# Patient Record
Sex: Female | Born: 1935 | Race: White | Hispanic: No
Health system: Southern US, Community
[De-identification: ages and names within clinical notes are randomized; demographics above are authoritative.]

---

## 2013-12-28 ENCOUNTER — Inpatient Hospital Stay
Admission: AD | Admit: 2013-12-28 | Discharge: 2014-02-02 | Disposition: A | Discharge status: Skilled Nursing Facility | Payer: Self-pay | Source: Ambulatory Visit | Attending: Internal Medicine | Admitting: Internal Medicine

## 2013-12-28 DIAGNOSIS — R140 Abdominal distension (gaseous): Secondary | ICD-10-CM

## 2013-12-29 ENCOUNTER — Other Ambulatory Visit (HOSPITAL_COMMUNITY): Payer: Self-pay

## 2013-12-29 LAB — BLOOD GAS, ARTERIAL
Acid-Base Excess: 3.5 mmol/L — ABNORMAL HIGH (ref 0.0–2.0)
Acid-Base Excess: 1.3 mmol/L (ref 0.0–2.0)
BICARBONATE: 31.3 meq/L — AB (ref 20.0–24.0)
Bicarbonate: 28.5 mEq/L — ABNORMAL HIGH (ref 20.0–24.0)
DELIVERY SYSTEMS: POSITIVE
Expiratory PAP: 5
FIO2: 0.6 %
Inspiratory PAP: 10
O2 Content: 4 L/min
O2 SAT: 95.9 %
O2 Saturation: 89.4 %
PATIENT TEMPERATURE: 98.6
PCO2 ART: 86.9 mmHg — AB (ref 35.0–45.0)
Patient temperature: 98.6
TCO2: 34 mmol/L (ref 0–100)
TCO2: 30.8 mmol/L (ref 0–100)
pCO2 arterial: 74.6 mmHg (ref 35.0–45.0)
pH, Arterial: 7.182 — CL (ref 7.350–7.450)
pH, Arterial: 7.207 — ABNORMAL LOW (ref 7.350–7.450)
pO2, Arterial: 63.5 mmHg — ABNORMAL LOW (ref 80.0–100.0)
pO2, Arterial: 88.4 mmHg (ref 80.0–100.0)

## 2013-12-29 LAB — FOLATE RBC: RBC Folate: 956 ng/mL — ABNORMAL HIGH (ref >280)

## 2013-12-29 LAB — CBC WITH DIFFERENTIAL/PLATELET
BASOS PCT: 1 % (ref 0–1)
Basophils Absolute: 0.1 10*3/uL (ref 0.0–0.1)
Eosinophils Absolute: 0.1 10*3/uL (ref 0.0–0.7)
Eosinophils Relative: 1 % (ref 0–5)
HCT: 36 % (ref 36.0–46.0)
HEMOGLOBIN: 11.2 g/dL — AB (ref 12.0–15.0)
LYMPHS PCT: 8 % — AB (ref 12–46)
Lymphs Abs: 0.8 10*3/uL (ref 0.7–4.0)
MCH: 29.9 pg (ref 26.0–34.0)
MCHC: 31.1 g/dL (ref 30.0–36.0)
MCV: 96.3 fL (ref 78.0–100.0)
MONO ABS: 0.4 10*3/uL (ref 0.1–1.0)
Monocytes Relative: 4 % (ref 3–12)
NEUTROS PCT: 86 % — AB (ref 43–77)
Neutro Abs: 9 10*3/uL — ABNORMAL HIGH (ref 1.7–7.7)
PLATELETS: 225 10*3/uL (ref 150–400)
RBC: 3.74 MIL/uL — AB (ref 3.87–5.11)
RDW: 16.2 % — ABNORMAL HIGH (ref 11.5–15.5)
WBC: 10.4 10*3/uL (ref 4.0–10.5)

## 2013-12-29 LAB — COMPREHENSIVE METABOLIC PANEL
ALK PHOS: 82 U/L (ref 39–117)
ALT: 13 U/L (ref 0–35)
AST: 16 U/L (ref 0–37)
Albumin: 2.1 g/dL — ABNORMAL LOW (ref 3.5–5.2)
BUN: 30 mg/dL — ABNORMAL HIGH (ref 6–23)
CALCIUM: 9.9 mg/dL (ref 8.4–10.5)
CO2: 29 mEq/L (ref 19–32)
Chloride: 105 mEq/L (ref 96–112)
Creatinine, Ser: 0.46 mg/dL — ABNORMAL LOW (ref 0.50–1.10)
GFR calc Af Amer: >90 mL/min (ref >90)
GFR calc non Af Amer: >90 mL/min (ref >90)
GLUCOSE: 175 mg/dL — AB (ref 70–99)
POTASSIUM: 4.7 meq/L (ref 3.7–5.3)
SODIUM: 140 meq/L (ref 137–147)
Total Bilirubin: 0.3 mg/dL (ref 0.3–1.2)
Total Protein: 5.1 g/dL — ABNORMAL LOW (ref 6.0–8.3)

## 2013-12-29 LAB — APTT: aPTT: 30 seconds (ref 24–37)

## 2013-12-29 LAB — MAGNESIUM: Magnesium: 2 mg/dL (ref 1.5–2.5)

## 2013-12-29 LAB — HEMOGLOBIN A1C
HEMOGLOBIN A1C: 5.5 % (ref <5.7)
MEAN PLASMA GLUCOSE: 111 mg/dL (ref <117)

## 2013-12-29 LAB — PROTIME-INR
INR: 1.12 (ref 0.00–1.49)
PROTHROMBIN TIME: 14.2 s (ref 11.6–15.2)

## 2013-12-29 LAB — VITAMIN B12: Vitamin B-12: 678 pg/mL (ref 211–911)

## 2013-12-29 LAB — FERRITIN: Ferritin: 144 ng/mL (ref 10–291)

## 2013-12-29 LAB — TSH: TSH: 1.08 u[IU]/mL (ref 0.350–4.500)

## 2013-12-29 LAB — T4, FREE: Free T4: 0.7 ng/dL — ABNORMAL LOW (ref 0.80–1.80)

## 2013-12-29 LAB — PROCALCITONIN

## 2013-12-29 LAB — PREALBUMIN: PREALBUMIN: 13.9 mg/dL — AB (ref 17.0–34.0)

## 2013-12-29 LAB — PHOSPHORUS: Phosphorus: 3.7 mg/dL (ref 2.3–4.6)

## 2013-12-29 NOTE — Procedures (Signed)
US Rt thora  550 cc yellow fluid cxr pending

## 2013-12-29 NOTE — Progress Notes (Addendum)
[facility]                                                                                              Progress note     Patient Demographics  Donna Cervantes, is a [age] y.o. female  ZOX:[id number]  WJX:[id number]  DOB - 11/18/1931  Admit date - 12/28/2013  Admitting Physician Carron CurieAli Viktoriya Glaspy, MD  Outpatient Primary MD for the patient is Default, Provider, MD  LOS - 1   Chief complaint   Respiratory failure   Wound   Status post perforated bowel was septic shock status post 6 without ectopy laparotomy and 4/22   Deconditioning         Subjective:   Donna PennerMary Cervantes obtunded and cannot give any history  Objective:   Vital signs  Temperature 96 Heart rate 56 Respiratory rate 22 Blood pressure 100/58 Pulse ox 93%    Exam Obtunded, [place].AT,PERRAL Supple Neck,No JVD, No cervical lymphadenopathy appriciated.  Symmetrical Chest wall movement, poor respiratory efforts RRR,No Gallops,Rubs or new Murmurs, No Parasternal Heave +ve B.Sounds, Abd Soft, wound noted healing with primary intention, colostomy noted No Cyanosis, Clubbing + edema, No new Rash or bruise    I&Os 563  Data Review   CBC  Recent Labs Lab 12/29/13 0500  WBC 10.4  HGB 11.2*  HCT 36.0  PLT 225  MCV 96.3  MCH 29.9  MCHC 31.1  RDW 16.2*  LYMPHSABS 0.8  MONOABS 0.4  EOSABS 0.1  BASOSABS 0.1    Chemistries   Recent Labs Lab 12/29/13 0500  NA 140  K 4.7  CL 105  CO2 29  GLUCOSE 175*  BUN 30*  CREATININE 0.46*  CALCIUM 9.9  MG 2.0  AST 16  ALT 13  ALKPHOS 82  BILITOT 0.3   ------------------------------------------------------------------------------------------------------------------ CrCl is unknown because there is no height on file for the current visit. ------------------------------------------------------------------------------------------------------------------ No results found for this  basename: HGBA1C,  in the last 72 hours ------------------------------------------------------------------------------------------------------------------ No results found for this basename: CHOL, HDL, LDLCALC, TRIG, CHOLHDL, LDLDIRECT,  in the last 72 hours ------------------------------------------------------------------------------------------------------------------  Recent Labs  12/29/13 0445  TSH 1.080   ------------------------------------------------------------------------------------------------------------------  Recent Labs  12/29/13 0500  VITAMINB12 678  FERRITIN 144    Coagulation profile  Recent Labs Lab 12/29/13 0500  INR 1.12    No results found for this basename: DDIMER,  in the last 72 hours  Cardiac Enzymes No results found for this basename: CK, CKMB, TROPONINI, MYOGLOBIN,  in the last 168 hours ------------------------------------------------------------------------------------------------------------------ No components found with this basename: POCBNP,   Micro Results No results found for this or any previous visit (from the past 240 hour(s)).     Assessment & Plan    Acute hypercarbic respiratory failure with elevated CO2 in the 80s Diastolic congestive heart failure  wound healing by primary intention History of perforated bowel with cecal volvulus  status post emergent laparotomy and 4/22 with a      right hemicolectomy History of GI bleed with resulting anemia Diabetes mellitus type 2 COPD History of left lower extremity DVT Descending thoracic aortic aneurysm Protein calorie malnutrition  on TPN Altered mental status Generalized weakness with deconditioning Large bilateral pleural effusions  Plan Place on BiPAP Lasix  consult IR for thoracentesis Check labs in a.m.  critical care time 38 minutes   Code Status: DO NOT RESUSCITATE  DVT Prophylaxis  SCDs   Carron CurieAli Viktoria Gruetzmacher M.D on 12/29/2013 at 11:04 AM

## 2013-12-30 ENCOUNTER — Other Ambulatory Visit (HOSPITAL_COMMUNITY): Payer: Self-pay

## 2013-12-30 DIAGNOSIS — I369 Nonrheumatic tricuspid valve disorder, unspecified: Secondary | ICD-10-CM

## 2013-12-30 LAB — BLOOD GAS, ARTERIAL
ACID-BASE EXCESS: 8.8 mmol/L — AB (ref 0.0–2.0)
Acid-Base Excess: 10.1 mmol/L — ABNORMAL HIGH (ref 0.0–2.0)
BICARBONATE: 34.1 meq/L — AB (ref 20.0–24.0)
BICARBONATE: 34.5 meq/L — AB (ref 20.0–24.0)
Delivery systems: POSITIVE
Expiratory PAP: 5
FIO2: 0.4 %
FIO2: 0.4 %
Inspiratory PAP: 10
Mode: POSITIVE
O2 SAT: 92.1 %
O2 Saturation: 98.7 %
PCO2 ART: 59.5 mmHg — AB (ref 35.0–45.0)
PH ART: 7.376 (ref 7.350–7.450)
PH ART: 7.46 — AB (ref 7.350–7.450)
PO2 ART: 101 mmHg — AB (ref 80.0–100.0)
PO2 ART: 57.6 mmHg — AB (ref 80.0–100.0)
Patient temperature: 98.6
Patient temperature: 98.6
TCO2: 35.9 mmol/L (ref 0–100)
TCO2: 36 mmol/L (ref 0–100)
pCO2 arterial: 49.1 mmHg — ABNORMAL HIGH (ref 35.0–45.0)

## 2013-12-30 LAB — BASIC METABOLIC PANEL
BUN: 26 mg/dL — AB (ref 6–23)
CALCIUM: 9.5 mg/dL (ref 8.4–10.5)
CO2: 34 mEq/L — ABNORMAL HIGH (ref 19–32)
Chloride: 106 mEq/L (ref 96–112)
Creatinine, Ser: 0.42 mg/dL — ABNORMAL LOW (ref 0.50–1.10)
GLUCOSE: 108 mg/dL — AB (ref 70–99)
Potassium: 3.8 mEq/L (ref 3.7–5.3)
Sodium: 144 mEq/L (ref 137–147)

## 2013-12-30 LAB — CBC
HEMATOCRIT: 33.6 % — AB (ref 36.0–46.0)
Hemoglobin: 10.3 g/dL — ABNORMAL LOW (ref 12.0–15.0)
MCH: 29.9 pg (ref 26.0–34.0)
MCHC: 30.7 g/dL (ref 30.0–36.0)
MCV: 97.7 fL (ref 78.0–100.0)
Platelets: 229 10*3/uL (ref 150–400)
RBC: 3.44 MIL/uL — ABNORMAL LOW (ref 3.87–5.11)
RDW: 15.9 % — AB (ref 11.5–15.5)
WBC: 10.7 10*3/uL — ABNORMAL HIGH (ref 4.0–10.5)

## 2013-12-30 NOTE — Progress Notes (Signed)
Echo Lab  2D Echocardiogram completed.  Lewis Grivas L Hoby Kawai, RDCS 12/30/2013 2:02 PM   

## 2013-12-30 NOTE — Progress Notes (Signed)
[facility]                                                                                              Progress note     Patient Demographics  Donna Cervantes, is a 80 y.o. female  ZOX:[id number]  WJX:[id number]  DOB - 1932/04/29  Admit date - 12/28/2013  Admitting Physician Carron CurieAli Genelda Roark, MD  Outpatient Primary MD for the patient is Default, Provider, MD  LOS - 2   Chief complaint   Respiratory failure   Wound   Status post perforated bowel was septic shock status post 6 without ectopy laparotomy and 4/22   Deconditioning         Subjective:   Donna PennerMary Tamas more alert and awake on Ventimask  Objective:   Vital signs  Temperature 98.3 Heart rate 54 Respiratory rate 20 Blood pressure 110/60 Pulse ox 98%    Exam More alert and awake [place].AT,PERRAL Supple Neck,No JVD, No cervical lymphadenopathy appriciated.  Symmetrical Chest wall movement, poor air entry, decreased breath sounds at the bases RRR,No Gallops,Rubs or new Murmurs, No Parasternal Heave +ve B.Sounds, Abd Soft, wound noted healing with primary intention, colostomy noted No Cyanosis, Clubbing + edema, No new Rash or bruise    I&Os 1745/3000  Data Review   CBC  Recent Labs Lab 12/29/13 0500 12/30/13 0717  WBC 10.4 10.7*  HGB 11.2* 10.3*  HCT 36.0 33.6*  PLT 225 229  MCV 96.3 97.7  MCH 29.9 29.9  MCHC 31.1 30.7  RDW 16.2* 15.9*  LYMPHSABS 0.8  --   MONOABS 0.4  --   EOSABS 0.1  --   BASOSABS 0.1  --     Chemistries   Recent Labs Lab 12/29/13 0500 12/30/13 0717  NA 140 144  K 4.7 3.8  CL 105 106  CO2 29 34*  GLUCOSE 175* 108*  BUN 30* 26*  CREATININE 0.46* 0.42*  CALCIUM 9.9 9.5  MG 2.0  --   AST 16  --   ALT 13  --   ALKPHOS 82  --   BILITOT 0.3  --    ------------------------------------------------------------------------------------------------------------------ CrCl is unknown because  there is no height on file for the current visit. ------------------------------------------------------------------------------------------------------------------  Recent Labs  12/29/13 0500  HGBA1C 5.5   ------------------------------------------------------------------------------------------------------------------ No results found for this basename: CHOL, HDL, LDLCALC, TRIG, CHOLHDL, LDLDIRECT,  in the last 72 hours ------------------------------------------------------------------------------------------------------------------  Recent Labs  12/29/13 0445  TSH 1.080   ------------------------------------------------------------------------------------------------------------------  Recent Labs  12/29/13 0500  VITAMINB12 678  FERRITIN 144    Coagulation profile  Recent Labs Lab 12/29/13 0500  INR 1.12    No results found for this basename: DDIMER,  in the last 72 hours  Cardiac Enzymes No results found for this basename: CK, CKMB, TROPONINI, MYOGLOBIN,  in the last 168 hours ------------------------------------------------------------------------------------------------------------------ No components found with this basename: POCBNP,   Micro Results No results found for this or any previous visit (from the past 240 hour(s)).     Assessment & Plan    Acute hypercarbic respiratory failure with elevated CO2 in the 80s Diastolic congestive heart failure  wound healing by primary intention History of perforated bowel with cecal volvulus  status post emergent laparotomy and 4/22 with a      right hemicolectomy History of GI bleed with resulting anemia Diabetes mellitus type 2 COPD History of left lower extremity DVT Descending thoracic aortic aneurysm Protein calorie malnutrition on TPN Altered mental status Generalized weakness with deconditioning Large bilateral pleural effusions status post right thoracentesis with 540 mL of fluid  removed  Plan Place on Oxymizer Check ABGs in a.m. Left thoracentesis on Monday Continue diuresis Critical care time 32 minutes   Code Status: DO NOT RESUSCITATE  DVT Prophylaxis  SCDs   Carron CurieAli Egor Fullilove M.D on 12/30/2013 at 11:45 AM

## 2013-12-31 LAB — BLOOD GAS, ARTERIAL
ACID-BASE EXCESS: 13.2 mmol/L — AB (ref 0.0–2.0)
Acid-Base Excess: 12.5 mmol/L — ABNORMAL HIGH (ref 0.0–2.0)
BICARBONATE: 36.4 meq/L — AB (ref 20.0–24.0)
BICARBONATE: 37.2 meq/L — AB (ref 20.0–24.0)
Delivery systems: POSITIVE
Expiratory PAP: 5
FIO2: 0.4 %
INSPIRATORY PAP: 10
O2 Content: 4 L/min
O2 SAT: 97.1 %
O2 SAT: 91.1 %
PCO2 ART: 45.4 mmHg — AB (ref 35.0–45.0)
PH ART: 7.519 — AB (ref 7.350–7.450)
PO2 ART: 60.7 mmHg — AB (ref 80.0–100.0)
PRESSURE SUPPORT: 5 cmH2O
Patient temperature: 98.6
Patient temperature: 98.6
TCO2: 37.8 mmol/L (ref 0–100)
TCO2: 38.6 mmol/L (ref 0–100)
pCO2 arterial: 45 mmHg (ref 35.0–45.0)
pH, Arterial: 7.524 — ABNORMAL HIGH (ref 7.350–7.450)
pO2, Arterial: 80 mmHg (ref 80.0–100.0)

## 2013-12-31 LAB — BASIC METABOLIC PANEL
BUN: 23 mg/dL (ref 6–23)
CALCIUM: 9 mg/dL (ref 8.4–10.5)
CHLORIDE: 99 meq/L (ref 96–112)
CO2: 39 meq/L — AB (ref 19–32)
Creatinine, Ser: 0.47 mg/dL — ABNORMAL LOW (ref 0.50–1.10)
GFR calc Af Amer: >90 mL/min (ref >90)
GFR calc non Af Amer: >90 mL/min (ref >90)
Glucose, Bld: 96 mg/dL (ref 70–99)
Potassium: 3.3 mEq/L — ABNORMAL LOW (ref 3.7–5.3)
Sodium: 142 mEq/L (ref 137–147)

## 2013-12-31 NOTE — Progress Notes (Signed)
[facility]                                                                                              Progress note     Patient Demographics  Donna PennerMary Cervantes, is a 80 y.o. female  ZOX:[id number]  WJX:[id number]  DOB - 1932/07/19  Admit date - 12/28/2013  Admitting Physician Carron CurieAli Clair [place], MD  Outpatient Primary MD for the patient is Default, Provider, MD  LOS - 3   Chief complaint   Respiratory failure   Wound   Status post perforated bowel was septic shock status post 6 without ectopy laparotomy and 4/22   Deconditioning         Subjective:   Donna PennerMary Cervantes more alert and awake on BiPAP. Had to be placed on that yesterday after she failed weaning clinically  Objective:   Vital signs  Temperature 98.1 Heart rate 76 Respiratory rate 22 Blood pressure 94/61 Pulse ox 95%    Exam More alert and awake [place].AT,PERRAL Supple Neck,No JVD, No cervical lymphadenopathy appriciated.  Symmetrical Chest wall movement, poor air entry, decreased breath sounds at the bases RRR,No Gallops,Rubs or new Murmurs, No Parasternal Heave +ve B.Sounds, Abd Soft, wound noted healing with primary intention, colostomy noted No Cyanosis, Clubbing + edema, No new Rash or bruise    I&Os 1440/4600  Data Review   CBC  Recent Labs Lab 12/29/13 0500 12/30/13 0717  WBC 10.4 10.7*  HGB 11.2* 10.3*  HCT 36.0 33.6*  PLT 225 229  MCV 96.3 97.7  MCH 29.9 29.9  MCHC 31.1 30.7  RDW 16.2* 15.9*  LYMPHSABS 0.8  --   MONOABS 0.4  --   EOSABS 0.1  --   BASOSABS 0.1  --     Chemistries   Recent Labs Lab 12/29/13 0500 12/30/13 0717 12/31/13 0500  NA 140 144 142  K 4.7 3.8 3.3*  CL 105 106 99  CO2 29 34* 39*  GLUCOSE 175* 108* 96  BUN 30* 26* 23  CREATININE 0.46* 0.42* 0.47*  CALCIUM 9.9 9.5 9.0  MG 2.0  --   --   AST 16  --   --   ALT 13  --   --   ALKPHOS 82  --   --   BILITOT 0.3  --   --     ------------------------------------------------------------------------------------------------------------------ CrCl is unknown because there is no height on file for the current visit. ------------------------------------------------------------------------------------------------------------------  Recent Labs  12/29/13 0500  HGBA1C 5.5   ------------------------------------------------------------------------------------------------------------------ No results found for this basename: CHOL, HDL, LDLCALC, TRIG, CHOLHDL, LDLDIRECT,  in the last 72 hours ------------------------------------------------------------------------------------------------------------------  Recent Labs  12/29/13 0445  TSH 1.080   ------------------------------------------------------------------------------------------------------------------  Recent Labs  12/29/13 0500  VITAMINB12 678  FERRITIN 144    Coagulation profile  Recent Labs Lab 12/29/13 0500  INR 1.12    No results found for this basename: DDIMER,  in the last 72 hours  Cardiac Enzymes No results found for this basename: CK, CKMB, TROPONINI, MYOGLOBIN,  in the last 168 hours ------------------------------------------------------------------------------------------------------------------ No components found with this basename: POCBNP,   Micro Results No  results found for this or any previous visit (from the past 240 hour(s)).  Echocardiogram shows systolic function to be normal with grade 1 diastolic dysfunction   Assessment & Plan    Acute hypercarbic respiratory failure with elevated CO2 in the 80s Diastolic congestive heart failure/grade 1  wound healing by primary intention History of perforated bowel with cecal volvulus  status post emergent laparotomy and 4/22 with a      right hemicolectomy History of GI bleed with resulting anemia Diabetes mellitus type 2 controlled  COPD continue with nebs History of  left lower extremity DVT not on anticoagulation due to bleeding Descending thoracic aortic aneurysm monitor Protein calorie malnutrition on TPN Altered mental status improving Generalized weakness with deconditioning Large bilateral pleural effusions status post right thoracentesis with 540 mL of fluid removed  Plan Place on ventimask Check ABGs in a.m. Left thoracentesis on Monday Continue diuresis Critical care time 33 minutes Discussed with family at length who were at bedside yesterday Consider PEG placement if continues to be n.p.o.   Code Status: DO NOT RESUSCITATE  DVT Prophylaxis  SCDs   Carron CurieAli Derin Matthes M.D on 12/31/2013 at 12:25 PM

## 2014-01-01 LAB — BLOOD GAS, ARTERIAL
Acid-Base Excess: 17.5 mmol/L — ABNORMAL HIGH (ref 0.0–2.0)
BICARBONATE: 42.3 meq/L — AB (ref 20.0–24.0)
O2 Content: 4 L/min
O2 SAT: 94.6 %
PCO2 ART: 53.8 mmHg — AB (ref 35.0–45.0)
PH ART: 7.506 — AB (ref 7.350–7.450)
PO2 ART: 67.1 mmHg — AB (ref 80.0–100.0)
Patient temperature: 98
TCO2: 44 mmol/L (ref 0–100)

## 2014-01-03 ENCOUNTER — Other Ambulatory Visit (HOSPITAL_COMMUNITY): Payer: Self-pay

## 2014-01-04 ENCOUNTER — Other Ambulatory Visit (HOSPITAL_COMMUNITY): Payer: Self-pay

## 2014-01-04 LAB — BASIC METABOLIC PANEL
BUN: 23 mg/dL (ref 6–23)
CHLORIDE: 97 meq/L (ref 96–112)
CO2: 38 meq/L — AB (ref 19–32)
CREATININE: 0.44 mg/dL — AB (ref 0.50–1.10)
Calcium: 9.1 mg/dL (ref 8.4–10.5)
GFR calc Af Amer: >90 mL/min (ref >90)
GFR calc non Af Amer: >90 mL/min (ref >90)
Glucose, Bld: 152 mg/dL — ABNORMAL HIGH (ref 70–99)
Potassium: 3.7 mEq/L (ref 3.7–5.3)
Sodium: 141 mEq/L (ref 137–147)

## 2014-01-04 LAB — DIGOXIN LEVEL: Digoxin Level: 0.5 ng/mL — ABNORMAL LOW (ref 0.8–2.0)

## 2014-01-07 NOTE — Progress Notes (Addendum)
IR PA aware of request for image guided percutaneous gastrostomy tube placement. Chart history reviewed, patient with recent emergent ex lap and right hemicolectomy and history of perforated peptic ulcer, date or treatment unknown per chart review? Dr Arnette NorrisBarakat will contact discharging facility and obtain records. I have spoke to Dr. Deanne CofferHassell and Dr. Arnette NorrisBarakat and IR would like GI involved given this history.  Pattricia BossKoreen Morgan PA-C Interventional Radiology  01/07/14  10:14 AM   MD called back to revise given history: apparently there was no perforated peptic ulcer. The perforation requiring surgical repair was colonic. No prior gastric surgeries or h/o gastric ulcer. Therefore, we could proceed with Gastrostomy placement. Would need para if any resid ascites at time of procedure. DDH [phone]

## 2014-01-08 NOTE — Progress Notes (Signed)
[facility]                                                                                              Progress note     Patient Demographics  Donna PennerMary Cervantes, is a 80 y.o. female  ZOX:[id number]  WJX:[id number]  DOB - 02-23-1932  Admit date - 12/28/2013  Admitting Physician Carron CurieAli Aslee Such, MD  Outpatient Primary MD for the patient is Default, Provider, MD  LOS - 11   Chief complaint   Respiratory failure   Wound   Status post perforated bowel was septic shock status post 6 without ectopy laparotomy and 4/22   Deconditioning         Subjective:   Donna Cervantes denies any chest pains or shortness of breath. Denies nausea vomiting or diarrhea   Objective:   Vital signs  Temperature 97.4 Heart rate 64 Respiratory rate 18 Blood pressure 115/66 Pulse ox 100%    Exam More alert and awake [place].AT,PERRAL Supple Neck,No JVD, No cervical lymphadenopathy appriciated.  Symmetrical Chest wall movement, poor air entry, decreased breath sounds at the bases RRR,No Gallops,Rubs or new Murmurs, No Parasternal Heave +ve B.Sounds, Abd Soft, wound noted healing with primary intention, colostomy noted No Cyanosis, Clubbing + edema, No new Rash or bruise    I&Os 2500/4075  Data Review   CBC No results found for this basename: WBC, HGB, HCT, PLT, MCV, MCH, MCHC, RDW, NEUTRABS, LYMPHSABS, MONOABS, EOSABS, BASOSABS, BANDABS, BANDSABD,  in the last 168 hours  Chemistries   Recent Labs Lab 01/04/14 0731  NA 141  K 3.7  CL 97  CO2 38*  GLUCOSE 152*  BUN 23  CREATININE 0.44*  CALCIUM 9.1   ------------------------------------------------------------------------------------------------------------------ CrCl is unknown because there is no height on file for the current visit. ------------------------------------------------------------------------------------------------------------------ No results  found for this basename: HGBA1C,  in the last 72 hours ------------------------------------------------------------------------------------------------------------------ No results found for this basename: CHOL, HDL, LDLCALC, TRIG, CHOLHDL, LDLDIRECT,  in the last 72 hours ------------------------------------------------------------------------------------------------------------------ No results found for this basename: TSH, T4TOTAL, FREET3, T3FREE, THYROIDAB,  in the last 72 hours ------------------------------------------------------------------------------------------------------------------ No results found for this basename: VITAMINB12, FOLATE, FERRITIN, TIBC, IRON, RETICCTPCT,  in the last 72 hours  Coagulation profile No results found for this basename: INR, PROTIME,  in the last 168 hours  No results found for this basename: DDIMER,  in the last 72 hours  Cardiac Enzymes No results found for this basename: CK, CKMB, TROPONINI, MYOGLOBIN,  in the last 168 hours ------------------------------------------------------------------------------------------------------------------ No components found with this basename: POCBNP,   Micro Results No results found for this or any previous visit (from the past 240 hour(s)).  Echocardiogram shows systolic function to be normal with grade 1 diastolic dysfunction  Chest x-ray shows increase in pulmonary edema   Assessment & Plan    Acute hypercarbic respiratory failure improving Diastolic congestive heart failure/grade 1 continue with Lasix  Abdominal wound healing by primary intention History of perforated bowel with cecal volvulus  status post emergent laparotomy and 4/22 with a      right hemicolectomy History of GI bleed with resulting anemia Diabetes  mellitus type 2 controlled  COPD continue with nebs History of left lower extremity DVT not on anticoagulation due to bleeding Descending thoracic aortic aneurysm monitor Protein  calorie malnutrition 4 PEG placement today Altered mental status results Generalized weakness with deconditioning Large bilateral pleural effusions status post right thoracentesis with 540 mL of fluid removed  Plan Add Aldactone Lasix 40 mg IV x-ray today 4 PEG placement today   Code Status: DO NOT RESUSCITATE  DVT Prophylaxis  SCDs   Carron CurieAli Shishir Krantz M.D on 01/08/2014 at 11:02 AM

## 2014-01-09 NOTE — Progress Notes (Signed)
[facility]                                                                                              Progress note     Patient Demographics  Donna PennerMary Pasillas, is a 80 y.o. female  [id number]  [id number]  DOB - 1931/09/22  Admit date - 12/28/2013  Admitting Physician Carron CurieAli Alvine Mostafa, MD  Outpatient Primary MD for the patient is Default, Provider, MD  LOS - 12   Chief complaint   Respiratory failure   Wound   Status post perforated bowel was septic shock status post 6 without ectopy laparotomy and 4/22   Deconditioning         Subjective:   Donna PennerMary Gellis denies any chest pains or shortness of breath. Denies nausea vomiting or diarrhea   Objective:   Vital signs  Temperature 98.1 Heart rate 82 Respiratory rate 18 Blood pressure 91/47 Pulse ox 96%     Exam  alert and awake, confused [place].AT,PERRAL Supple Neck,No JVD, No cervical lymphadenopathy appriciated.  Symmetrical Chest wall movement, poor air entry, decreased breath sounds at the bases RRR,No Gallops,Rubs or new Murmurs, No Parasternal Heave +ve B.Sounds, Abd Soft, wound noted healing with primary intention, colostomy noted No Cyanosis, Clubbing + edema, No new Rash or bruise    I&Os 2560/2550  Data Review   CBC No results found for this basename: WBC, HGB, HCT, PLT, MCV, MCH, MCHC, RDW, NEUTRABS, LYMPHSABS, MONOABS, EOSABS, BASOSABS, BANDABS, BANDSABD,  in the last 168 hours  Chemistries   Recent Labs Lab 01/04/14 0731  NA 141  K 3.7  CL 97  CO2 38*  GLUCOSE 152*  BUN 23  CREATININE 0.44*  CALCIUM 9.1   ------------------------------------------------------------------------------------------------------------------ CrCl is unknown because there is no height on file for the current visit. ------------------------------------------------------------------------------------------------------------------ No  results found for this basename: HGBA1C,  in the last 72 hours ------------------------------------------------------------------------------------------------------------------ No results found for this basename: CHOL, HDL, LDLCALC, TRIG, CHOLHDL, LDLDIRECT,  in the last 72 hours ------------------------------------------------------------------------------------------------------------------ No results found for this basename: TSH, T4TOTAL, FREET3, T3FREE, THYROIDAB,  in the last 72 hours ------------------------------------------------------------------------------------------------------------------ No results found for this basename: VITAMINB12, FOLATE, FERRITIN, TIBC, IRON, RETICCTPCT,  in the last 72 hours  Coagulation profile No results found for this basename: INR, PROTIME,  in the last 168 hours  No results found for this basename: DDIMER,  in the last 72 hours  Cardiac Enzymes No results found for this basename: CK, CKMB, TROPONINI, MYOGLOBIN,  in the last 168 hours ------------------------------------------------------------------------------------------------------------------ No components found with this basename: POCBNP,   Micro Results No results found for this or any previous visit (from the past 240 hour(s)).  Echocardiogram shows systolic function to be normal with grade 1 diastolic dysfunction  Chest x-ray shows increase in pulmonary edema   Assessment & Plan    Acute hypercarbic respiratory failure improving Diastolic congestive heart failure/grade 1 continue with Lasix  Abdominal wound healing by primary intention History of perforated bowel with cecal volvulus  status post emergent laparotomy and 4/22 with a      right hemicolectomy History of GI bleed with resulting  anemia Diabetes mellitus type 2 controlled  COPD continue with nebs History of left lower extremity DVT not on anticoagulation due to bleeding Descending thoracic aortic aneurysm  monitor Protein calorie malnutrition 4 PEG placement today Altered mental status results Generalized weakness with deconditioning Large bilateral pleural effusions status post right thoracentesis with 540 mL of fluid removed  Plan Continue same management PEG placement   Code Status: DO NOT RESUSCITATE  DVT Prophylaxis  SCDs   Carron CurieAli Chase Arnall M.D on 01/09/2014 at 11:02 AM

## 2014-01-10 LAB — CBC
HCT: 31.5 % — ABNORMAL LOW (ref 36.0–46.0)
Hemoglobin: 10 g/dL — ABNORMAL LOW (ref 12.0–15.0)
MCH: 29.4 pg (ref 26.0–34.0)
MCHC: 31.7 g/dL (ref 30.0–36.0)
MCV: 92.6 fL (ref 78.0–100.0)
PLATELETS: 269 10*3/uL (ref 150–400)
RBC: 3.4 MIL/uL — ABNORMAL LOW (ref 3.87–5.11)
RDW: 15.6 % — AB (ref 11.5–15.5)
WBC: 7.1 10*3/uL (ref 4.0–10.5)

## 2014-01-10 LAB — BASIC METABOLIC PANEL
BUN: 10 mg/dL (ref 6–23)
CHLORIDE: 93 meq/L — AB (ref 96–112)
CO2: 35 meq/L — AB (ref 19–32)
CREATININE: 0.55 mg/dL (ref 0.50–1.10)
Calcium: 9.3 mg/dL (ref 8.4–10.5)
GFR calc non Af Amer: 87 mL/min — ABNORMAL LOW (ref >90)
Glucose, Bld: 103 mg/dL — ABNORMAL HIGH (ref 70–99)
Potassium: 3.8 mEq/L (ref 3.7–5.3)
SODIUM: 135 meq/L — AB (ref 137–147)

## 2014-01-10 NOTE — Progress Notes (Signed)
[facility]                                                                                              Progress note     Patient Demographics  Donna Cervantes, is a 80 y.o. female  UJW:[id number]  FAO:[id number]  DOB - 1931/12/20  Admit date - 12/28/2013  Admitting Physician Carron CurieAli Mette [place], MD  Outpatient Primary MD for the patient is Default, Provider, MD  LOS - 13   Chief complaint   Respiratory failure   Wound   Status post perforated bowel was septic shock status post 6 without ectopy laparotomy and 4/22   Deconditioning         Subjective:   Donna PennerMary Coburn denies any chest pains or shortness of breath. Denies nausea vomiting or diarrhea   Objective:   Vital signs  Temperature 98.2 Heart rate 93 Respiratory rate 18 Blood pressure 117/74 Pulse ox 94%     Exam  alert and awake, confused [place].AT,PERRAL Supple Neck,No JVD, No cervical lymphadenopathy appriciated.  Symmetrical Chest wall movement, poor air entry, decreased breath sounds at the bases RRR,No Gallops,Rubs or new Murmurs, No Parasternal Heave +ve B.Sounds, Abd Soft, wound noted healing with primary intention, colostomy noted No Cyanosis, Clubbing + edema, No new Rash or bruise    I&Os 2240/1560  Data Review   CBC  Recent Labs Lab 01/10/14 0558  WBC 7.1  HGB 10.0*  HCT 31.5*  PLT 269  MCV 92.6  MCH 29.4  MCHC 31.7  RDW 15.6*    Chemistries   Recent Labs Lab 01/04/14 0731 01/10/14 0558  NA 141 135*  K 3.7 3.8  CL 97 93*  CO2 38* 35*  GLUCOSE 152* 103*  BUN 23 10  CREATININE 0.44* 0.55  CALCIUM 9.1 9.3   ------------------------------------------------------------------------------------------------------------------ CrCl is unknown because there is no height on file for the current  visit. ------------------------------------------------------------------------------------------------------------------ No results found for this basename: HGBA1C,  in the last 72 hours ------------------------------------------------------------------------------------------------------------------ No results found for this basename: CHOL, HDL, LDLCALC, TRIG, CHOLHDL, LDLDIRECT,  in the last 72 hours ------------------------------------------------------------------------------------------------------------------ No results found for this basename: TSH, T4TOTAL, FREET3, T3FREE, THYROIDAB,  in the last 72 hours ------------------------------------------------------------------------------------------------------------------ No results found for this basename: VITAMINB12, FOLATE, FERRITIN, TIBC, IRON, RETICCTPCT,  in the last 72 hours  Coagulation profile No results found for this basename: INR, PROTIME,  in the last 168 hours  No results found for this basename: DDIMER,  in the last 72 hours  Cardiac Enzymes No results found for this basename: CK, CKMB, TROPONINI, MYOGLOBIN,  in the last 168 hours ------------------------------------------------------------------------------------------------------------------ No components found with this basename: POCBNP,   Micro Results No results found for this or any previous visit (from the past 240 hour(s)).  Echocardiogram shows systolic function to be normal with grade 1 diastolic dysfunction     Assessment & Plan    Acute hypercarbic respiratory failure improving Diastolic congestive heart failure/grade 1 continue with Lasix  Abdominal wound healing by primary intention, wound care nurse to follow History of perforated bowel with cecal volvulus  status post emergent laparotomy and 4/22 with  a right hemicolectomy History of GI bleed with resulting anemia Diabetes mellitus type 2 controlled  COPD continue with nebs History of left  lower extremity DVT not on anticoagulation due to bleeding Descending thoracic aortic aneurysm monitor Protein calorie malnutrition 4 PEG placement today Altered mental status results Generalized weakness with deconditioning Large bilateral pleural effusions status post right thoracentesis with 540 mL of fluid removed  Plan IR unable to place PEG due to [facility] discussion with the POA   Code Status: DO NOT RESUSCITATE  DVT Prophylaxis  SCDs   Carron CurieAli Juniper Snyders M.D on 01/10/2014 at 1:57 PM

## 2014-01-11 LAB — BLOOD GAS, ARTERIAL
ACID-BASE EXCESS: 9.3 mmol/L — AB (ref 0.0–2.0)
Bicarbonate: 33.1 mEq/L — ABNORMAL HIGH (ref 20.0–24.0)
O2 CONTENT: 2 L/min
O2 Saturation: 96.2 %
PO2 ART: 75.2 mmHg — AB (ref 80.0–100.0)
Patient temperature: 98.6
TCO2: 34.4 mmol/L (ref 0–100)
pCO2 arterial: 42.8 mmHg (ref 35.0–45.0)
pH, Arterial: 7.5 — ABNORMAL HIGH (ref 7.350–7.450)

## 2014-01-12 LAB — URINALYSIS, ROUTINE W REFLEX MICROSCOPIC
Bilirubin Urine: NEGATIVE
Glucose, UA: NEGATIVE mg/dL
KETONES UR: NEGATIVE mg/dL
Nitrite: NEGATIVE
PROTEIN: 30 mg/dL — AB
Specific Gravity, Urine: 1.013 (ref 1.005–1.030)
UROBILINOGEN UA: 2 mg/dL — AB (ref 0.0–1.0)
pH: 7.5 (ref 5.0–8.0)

## 2014-01-12 LAB — BASIC METABOLIC PANEL
BUN: 10 mg/dL (ref 6–23)
CALCIUM: 9 mg/dL (ref 8.4–10.5)
CO2: 29 mEq/L (ref 19–32)
Chloride: 93 mEq/L — ABNORMAL LOW (ref 96–112)
Creatinine, Ser: 0.46 mg/dL — ABNORMAL LOW (ref 0.50–1.10)
Glucose, Bld: 124 mg/dL — ABNORMAL HIGH (ref 70–99)
POTASSIUM: 3.9 meq/L (ref 3.7–5.3)
SODIUM: 133 meq/L — AB (ref 137–147)

## 2014-01-12 LAB — CBC WITH DIFFERENTIAL/PLATELET
BASOS ABS: 0 10*3/uL (ref 0.0–0.1)
Basophils Relative: 0 % (ref 0–1)
EOS ABS: 0.2 10*3/uL (ref 0.0–0.7)
EOS PCT: 2 % (ref 0–5)
HCT: 30.2 % — ABNORMAL LOW (ref 36.0–46.0)
Hemoglobin: 9.8 g/dL — ABNORMAL LOW (ref 12.0–15.0)
Lymphocytes Relative: 10 % — ABNORMAL LOW (ref 12–46)
Lymphs Abs: 0.9 10*3/uL (ref 0.7–4.0)
MCH: 29.4 pg (ref 26.0–34.0)
MCHC: 32.5 g/dL (ref 30.0–36.0)
MCV: 90.7 fL (ref 78.0–100.0)
Monocytes Absolute: 0.8 10*3/uL (ref 0.1–1.0)
Monocytes Relative: 9 % (ref 3–12)
NEUTROS PCT: 79 % — AB (ref 43–77)
Neutro Abs: 7.2 10*3/uL (ref 1.7–7.7)
PLATELETS: 259 10*3/uL (ref 150–400)
RBC: 3.33 MIL/uL — AB (ref 3.87–5.11)
RDW: 15 % (ref 11.5–15.5)
WBC: 9 10*3/uL (ref 4.0–10.5)

## 2014-01-12 LAB — PRO B NATRIURETIC PEPTIDE: Pro B Natriuretic peptide (BNP): 1088 pg/mL — ABNORMAL HIGH (ref 0–450)

## 2014-01-12 LAB — URINE MICROSCOPIC-ADD ON

## 2014-01-12 LAB — MAGNESIUM: MAGNESIUM: 1.5 mg/dL (ref 1.5–2.5)

## 2014-01-12 LAB — PHOSPHORUS: PHOSPHORUS: 2.7 mg/dL (ref 2.3–4.6)

## 2014-01-13 LAB — BASIC METABOLIC PANEL
BUN: 11 mg/dL (ref 6–23)
CO2: 29 meq/L (ref 19–32)
CREATININE: 0.44 mg/dL — AB (ref 0.50–1.10)
Calcium: 8.6 mg/dL (ref 8.4–10.5)
Chloride: 96 mEq/L (ref 96–112)
GFR calc Af Amer: >90 mL/min (ref >90)
GFR calc non Af Amer: >90 mL/min (ref >90)
Glucose, Bld: 114 mg/dL — ABNORMAL HIGH (ref 70–99)
Potassium: 3.3 mEq/L — ABNORMAL LOW (ref 3.7–5.3)
Sodium: 135 mEq/L — ABNORMAL LOW (ref 137–147)

## 2014-01-13 LAB — URINE CULTURE

## 2014-01-13 LAB — CBC
HCT: 29.9 % — ABNORMAL LOW (ref 36.0–46.0)
Hemoglobin: 9.8 g/dL — ABNORMAL LOW (ref 12.0–15.0)
MCH: 29.7 pg (ref 26.0–34.0)
MCHC: 32.8 g/dL (ref 30.0–36.0)
MCV: 90.6 fL (ref 78.0–100.0)
PLATELETS: 241 10*3/uL (ref 150–400)
RBC: 3.3 MIL/uL — AB (ref 3.87–5.11)
RDW: 14.7 % (ref 11.5–15.5)
WBC: 8.1 10*3/uL (ref 4.0–10.5)

## 2014-01-13 LAB — PROTIME-INR
INR: 1.13 (ref 0.00–1.49)
Prothrombin Time: 14.3 seconds (ref 11.6–15.2)

## 2014-01-14 ENCOUNTER — Encounter: Payer: Self-pay | Admitting: Radiology

## 2014-01-14 ENCOUNTER — Other Ambulatory Visit (HOSPITAL_COMMUNITY): Payer: Self-pay

## 2014-01-14 LAB — POTASSIUM: Potassium: 3.4 mEq/L — ABNORMAL LOW (ref 3.7–5.3)

## 2014-01-14 MED ORDER — IOHEXOL 300 MG/ML  SOLN: 25.0000 mL | INTRAMUSCULAR | Status: AC

## 2014-01-14 MED ORDER — IOHEXOL 300 MG/ML  SOLN
100.0000 mL | Freq: Once | INTRAMUSCULAR | Status: AC | PRN
  Administered 2014-01-14: 80 mL via INTRAVENOUS

## 2014-01-15 LAB — CBC WITH DIFFERENTIAL/PLATELET
Basophils Absolute: 0 10*3/uL (ref 0.0–0.1)
Basophils Relative: 0 % (ref 0–1)
EOS ABS: 0.5 10*3/uL (ref 0.0–0.7)
EOS PCT: 5 % (ref 0–5)
HCT: 34.6 % — ABNORMAL LOW (ref 36.0–46.0)
HEMOGLOBIN: 11.4 g/dL — AB (ref 12.0–15.0)
LYMPHS ABS: 0.3 10*3/uL — AB (ref 0.7–4.0)
Lymphocytes Relative: 3 % — ABNORMAL LOW (ref 12–46)
MCH: 30 pg (ref 26.0–34.0)
MCHC: 32.9 g/dL (ref 30.0–36.0)
MCV: 91.1 fL (ref 78.0–100.0)
MONOS PCT: 1 % — AB (ref 3–12)
Monocytes Absolute: 0.1 10*3/uL (ref 0.1–1.0)
Neutro Abs: 9.8 10*3/uL — ABNORMAL HIGH (ref 1.7–7.7)
Neutrophils Relative %: 91 % — ABNORMAL HIGH (ref 43–77)
Platelets: 280 10*3/uL (ref 150–400)
RBC: 3.8 MIL/uL — AB (ref 3.87–5.11)
RDW: 14.7 % (ref 11.5–15.5)
WBC: 10.7 10*3/uL — ABNORMAL HIGH (ref 4.0–10.5)

## 2014-01-15 LAB — COMPREHENSIVE METABOLIC PANEL
ALT: 30 U/L (ref 0–35)
AST: 29 U/L (ref 0–37)
Albumin: 2 g/dL — ABNORMAL LOW (ref 3.5–5.2)
Alkaline Phosphatase: 97 U/L (ref 39–117)
BUN: 11 mg/dL (ref 6–23)
CALCIUM: 9.2 mg/dL (ref 8.4–10.5)
CO2: 28 mEq/L (ref 19–32)
Chloride: 93 mEq/L — ABNORMAL LOW (ref 96–112)
Creatinine, Ser: 0.49 mg/dL — ABNORMAL LOW (ref 0.50–1.10)
GFR calc non Af Amer: >90 mL/min (ref >90)
GLUCOSE: 110 mg/dL — AB (ref 70–99)
Potassium: 3.7 mEq/L (ref 3.7–5.3)
Sodium: 132 mEq/L — ABNORMAL LOW (ref 137–147)
TOTAL PROTEIN: 6.2 g/dL (ref 6.0–8.3)
Total Bilirubin: 1.5 mg/dL — ABNORMAL HIGH (ref 0.3–1.2)

## 2014-01-15 LAB — PREALBUMIN: Prealbumin: 4.6 mg/dL — ABNORMAL LOW (ref 17.0–34.0)

## 2014-01-16 ENCOUNTER — Other Ambulatory Visit (HOSPITAL_COMMUNITY): Payer: Self-pay

## 2014-01-16 LAB — BASIC METABOLIC PANEL
BUN: 12 mg/dL (ref 6–23)
CO2: 27 meq/L (ref 19–32)
CREATININE: 0.55 mg/dL (ref 0.50–1.10)
Calcium: 9 mg/dL (ref 8.4–10.5)
Chloride: 97 mEq/L (ref 96–112)
GFR calc Af Amer: >90 mL/min (ref >90)
GFR calc non Af Amer: 87 mL/min — ABNORMAL LOW (ref >90)
Glucose, Bld: 95 mg/dL (ref 70–99)
Potassium: 3.7 mEq/L (ref 3.7–5.3)
Sodium: 135 mEq/L — ABNORMAL LOW (ref 137–147)

## 2014-01-16 LAB — CBC WITH DIFFERENTIAL/PLATELET
Basophils Absolute: 0 10*3/uL (ref 0.0–0.1)
Basophils Relative: 0 % (ref 0–1)
Eosinophils Absolute: 0.4 10*3/uL (ref 0.0–0.7)
Eosinophils Relative: 10 % — ABNORMAL HIGH (ref 0–5)
HCT: 33.6 % — ABNORMAL LOW (ref 36.0–46.0)
Hemoglobin: 10.7 g/dL — ABNORMAL LOW (ref 12.0–15.0)
LYMPHS ABS: 0.5 10*3/uL — AB (ref 0.7–4.0)
Lymphocytes Relative: 12 % (ref 12–46)
MCH: 29.2 pg (ref 26.0–34.0)
MCHC: 31.8 g/dL (ref 30.0–36.0)
MCV: 91.6 fL (ref 78.0–100.0)
Monocytes Absolute: 0.1 10*3/uL (ref 0.1–1.0)
Monocytes Relative: 3 % (ref 3–12)
Neutro Abs: 3.1 10*3/uL (ref 1.7–7.7)
Neutrophils Relative %: 75 % (ref 43–77)
Platelets: 248 10*3/uL (ref 150–400)
RBC: 3.67 MIL/uL — AB (ref 3.87–5.11)
RDW: 14.8 % (ref 11.5–15.5)
WBC: 4.2 10*3/uL (ref 4.0–10.5)

## 2014-01-18 LAB — CBC
HCT: 33.6 % — ABNORMAL LOW (ref 36.0–46.0)
Hemoglobin: 10.8 g/dL — ABNORMAL LOW (ref 12.0–15.0)
MCH: 29.8 pg (ref 26.0–34.0)
MCHC: 32.1 g/dL (ref 30.0–36.0)
MCV: 92.6 fL (ref 78.0–100.0)
Platelets: 250 10*3/uL (ref 150–400)
RBC: 3.63 MIL/uL — ABNORMAL LOW (ref 3.87–5.11)
RDW: 14.8 % (ref 11.5–15.5)
WBC: 5.1 10*3/uL (ref 4.0–10.5)

## 2014-01-18 LAB — BASIC METABOLIC PANEL
BUN: 16 mg/dL (ref 6–23)
CALCIUM: 9.1 mg/dL (ref 8.4–10.5)
CHLORIDE: 98 meq/L (ref 96–112)
CO2: 34 mEq/L — ABNORMAL HIGH (ref 19–32)
Creatinine, Ser: 0.66 mg/dL (ref 0.50–1.10)
GFR calc non Af Amer: 82 mL/min — ABNORMAL LOW (ref >90)
Glucose, Bld: 104 mg/dL — ABNORMAL HIGH (ref 70–99)
Potassium: 3.6 mEq/L — ABNORMAL LOW (ref 3.7–5.3)
Sodium: 142 mEq/L (ref 137–147)

## 2014-01-22 LAB — CBC WITH DIFFERENTIAL/PLATELET
BASOS ABS: 0 10*3/uL (ref 0.0–0.1)
Basophils Relative: 0 % (ref 0–1)
Eosinophils Absolute: 1.5 10*3/uL — ABNORMAL HIGH (ref 0.0–0.7)
Eosinophils Relative: 16 % — ABNORMAL HIGH (ref 0–5)
HCT: 36.9 % (ref 36.0–46.0)
Hemoglobin: 11.9 g/dL — ABNORMAL LOW (ref 12.0–15.0)
LYMPHS PCT: 25 % (ref 12–46)
Lymphs Abs: 2.3 10*3/uL (ref 0.7–4.0)
MCH: 29.2 pg (ref 26.0–34.0)
MCHC: 32.2 g/dL (ref 30.0–36.0)
MCV: 90.7 fL (ref 78.0–100.0)
Monocytes Absolute: 0.5 10*3/uL (ref 0.1–1.0)
Monocytes Relative: 6 % (ref 3–12)
NEUTROS ABS: 4.9 10*3/uL (ref 1.7–7.7)
Neutrophils Relative %: 53 % (ref 43–77)
PLATELETS: 251 10*3/uL (ref 150–400)
RBC: 4.07 MIL/uL (ref 3.87–5.11)
RDW: 14.7 % (ref 11.5–15.5)
WBC: 9.3 10*3/uL (ref 4.0–10.5)

## 2014-01-22 LAB — BASIC METABOLIC PANEL
BUN: 23 mg/dL (ref 6–23)
CO2: 35 meq/L — AB (ref 19–32)
Calcium: 9.1 mg/dL (ref 8.4–10.5)
Chloride: 93 mEq/L — ABNORMAL LOW (ref 96–112)
Creatinine, Ser: 0.69 mg/dL (ref 0.50–1.10)
GFR calc Af Amer: >90 mL/min (ref >90)
GFR calc non Af Amer: 81 mL/min — ABNORMAL LOW (ref >90)
Glucose, Bld: 108 mg/dL — ABNORMAL HIGH (ref 70–99)
Potassium: 4.1 mEq/L (ref 3.7–5.3)
SODIUM: 137 meq/L (ref 137–147)

## 2014-01-22 LAB — PREALBUMIN: PREALBUMIN: 13.5 mg/dL — AB (ref 17.0–34.0)

## 2014-01-23 ENCOUNTER — Other Ambulatory Visit (HOSPITAL_COMMUNITY): Payer: Medicare Other

## 2014-01-23 LAB — CLOSTRIDIUM DIFFICILE BY PCR: CDIFFPCR: POSITIVE — AB

## 2014-01-23 MED ORDER — IOHEXOL 300 MG/ML  SOLN: 80.0000 mL | Freq: Once | INTRAMUSCULAR | Status: AC | PRN

## 2014-01-23 MED ORDER — IOHEXOL 300 MG/ML  SOLN: 25.0000 mL | INTRAMUSCULAR | Status: AC

## 2014-01-24 LAB — PROTIME-INR
INR: 0.98 (ref 0.00–1.49)
PROTHROMBIN TIME: 12.8 s (ref 11.6–15.2)

## 2014-01-24 LAB — BASIC METABOLIC PANEL
BUN: 24 mg/dL — AB (ref 6–23)
CO2: 29 mEq/L (ref 19–32)
Calcium: 9.1 mg/dL (ref 8.4–10.5)
Chloride: 91 mEq/L — ABNORMAL LOW (ref 96–112)
Creatinine, Ser: 0.8 mg/dL (ref 0.50–1.10)
GFR calc non Af Amer: 68 mL/min — ABNORMAL LOW (ref >90)
GFR, EST AFRICAN AMERICAN: 79 mL/min — AB (ref >90)
Glucose, Bld: 98 mg/dL (ref 70–99)
POTASSIUM: 4.2 meq/L (ref 3.7–5.3)
SODIUM: 132 meq/L — AB (ref 137–147)

## 2014-01-24 LAB — CBC WITH DIFFERENTIAL/PLATELET
BASOS PCT: 0 % (ref 0–1)
Basophils Absolute: 0 10*3/uL (ref 0.0–0.1)
Eosinophils Absolute: 1.1 10*3/uL — ABNORMAL HIGH (ref 0.0–0.7)
Eosinophils Relative: 7 % — ABNORMAL HIGH (ref 0–5)
HCT: 38.2 % (ref 36.0–46.0)
Hemoglobin: 12.9 g/dL (ref 12.0–15.0)
LYMPHS ABS: 0.6 10*3/uL — AB (ref 0.7–4.0)
Lymphocytes Relative: 4 % — ABNORMAL LOW (ref 12–46)
MCH: 29.9 pg (ref 26.0–34.0)
MCHC: 33.8 g/dL (ref 30.0–36.0)
MCV: 88.4 fL (ref 78.0–100.0)
Monocytes Absolute: 0.3 10*3/uL (ref 0.1–1.0)
Monocytes Relative: 2 % — ABNORMAL LOW (ref 3–12)
NEUTROS ABS: 12.9 10*3/uL — AB (ref 1.7–7.7)
NEUTROS PCT: 87 % — AB (ref 43–77)
Platelets: 235 10*3/uL (ref 150–400)
RBC: 4.32 MIL/uL (ref 3.87–5.11)
RDW: 14.8 % (ref 11.5–15.5)
WBC: 14.9 10*3/uL — ABNORMAL HIGH (ref 4.0–10.5)

## 2014-01-24 LAB — PHOSPHORUS: PHOSPHORUS: 3.4 mg/dL (ref 2.3–4.6)

## 2014-01-24 LAB — MAGNESIUM: Magnesium: 1.2 mg/dL — ABNORMAL LOW (ref 1.5–2.5)

## 2014-01-25 LAB — CBC WITH DIFFERENTIAL/PLATELET
BASOS ABS: 0 10*3/uL (ref 0.0–0.1)
BASOS PCT: 0 % (ref 0–1)
EOS ABS: 1.6 10*3/uL — AB (ref 0.0–0.7)
Eosinophils Relative: 19 % — ABNORMAL HIGH (ref 0–5)
HCT: 36.2 % (ref 36.0–46.0)
Hemoglobin: 12 g/dL (ref 12.0–15.0)
Lymphocytes Relative: 13 % (ref 12–46)
Lymphs Abs: 1.1 10*3/uL (ref 0.7–4.0)
MCH: 29 pg (ref 26.0–34.0)
MCHC: 33.1 g/dL (ref 30.0–36.0)
MCV: 87.4 fL (ref 78.0–100.0)
Monocytes Absolute: 0.4 10*3/uL (ref 0.1–1.0)
Monocytes Relative: 5 % (ref 3–12)
NEUTROS ABS: 5.3 10*3/uL (ref 1.7–7.7)
NEUTROS PCT: 63 % (ref 43–77)
PLATELETS: 207 10*3/uL (ref 150–400)
RBC: 4.14 MIL/uL (ref 3.87–5.11)
RDW: 14.7 % (ref 11.5–15.5)
WBC: 8.4 10*3/uL (ref 4.0–10.5)

## 2014-01-25 LAB — MAGNESIUM: MAGNESIUM: 1.3 mg/dL — AB (ref 1.5–2.5)

## 2014-01-25 LAB — PHOSPHORUS: Phosphorus: 3.5 mg/dL (ref 2.3–4.6)

## 2014-01-25 LAB — BASIC METABOLIC PANEL
BUN: 27 mg/dL — ABNORMAL HIGH (ref 6–23)
CHLORIDE: 92 meq/L — AB (ref 96–112)
CO2: 31 mEq/L (ref 19–32)
Calcium: 9.3 mg/dL (ref 8.4–10.5)
Creatinine, Ser: 0.72 mg/dL (ref 0.50–1.10)
GFR calc Af Amer: >90 mL/min (ref >90)
GFR, EST NON AFRICAN AMERICAN: 80 mL/min — AB (ref >90)
Glucose, Bld: 91 mg/dL (ref 70–99)
Potassium: 4.1 mEq/L (ref 3.7–5.3)
SODIUM: 132 meq/L — AB (ref 137–147)

## 2014-01-27 LAB — DIGOXIN LEVEL: DIGOXIN LVL: 1.3 ng/mL (ref 0.8–2.0)

## 2014-01-28 LAB — BASIC METABOLIC PANEL
BUN: 22 mg/dL (ref 6–23)
CO2: 30 mEq/L (ref 19–32)
CREATININE: 0.63 mg/dL (ref 0.50–1.10)
Calcium: 9.8 mg/dL (ref 8.4–10.5)
Chloride: 95 mEq/L — ABNORMAL LOW (ref 96–112)
GFR calc Af Amer: >90 mL/min (ref >90)
GFR, EST NON AFRICAN AMERICAN: 83 mL/min — AB (ref >90)
Glucose, Bld: 88 mg/dL (ref 70–99)
Potassium: 4.1 mEq/L (ref 3.7–5.3)
SODIUM: 134 meq/L — AB (ref 137–147)

## 2014-01-28 LAB — MAGNESIUM: MAGNESIUM: 1.8 mg/dL (ref 1.5–2.5)

## 2014-01-29 LAB — CBC
HCT: 33.8 % — ABNORMAL LOW (ref 36.0–46.0)
HEMOGLOBIN: 11.2 g/dL — AB (ref 12.0–15.0)
MCH: 29.5 pg (ref 26.0–34.0)
MCHC: 33.1 g/dL (ref 30.0–36.0)
MCV: 88.9 fL (ref 78.0–100.0)
Platelets: 201 10*3/uL (ref 150–400)
RBC: 3.8 MIL/uL — AB (ref 3.87–5.11)
RDW: 15.2 % (ref 11.5–15.5)
WBC: 7.2 10*3/uL (ref 4.0–10.5)

## 2014-01-29 LAB — COMPREHENSIVE METABOLIC PANEL
ALK PHOS: 78 U/L (ref 39–117)
ALT: 8 U/L (ref 0–35)
AST: 11 U/L (ref 0–37)
Albumin: 2.5 g/dL — ABNORMAL LOW (ref 3.5–5.2)
BILIRUBIN TOTAL: 0.5 mg/dL (ref 0.3–1.2)
BUN: 19 mg/dL (ref 6–23)
CO2: 30 meq/L (ref 19–32)
Calcium: 9.4 mg/dL (ref 8.4–10.5)
Chloride: 93 mEq/L — ABNORMAL LOW (ref 96–112)
Creatinine, Ser: 0.61 mg/dL (ref 0.50–1.10)
GFR, EST NON AFRICAN AMERICAN: 84 mL/min — AB (ref >90)
GLUCOSE: 88 mg/dL (ref 70–99)
POTASSIUM: 4.3 meq/L (ref 3.7–5.3)
Sodium: 133 mEq/L — ABNORMAL LOW (ref 137–147)
Total Protein: 6.1 g/dL (ref 6.0–8.3)

## 2014-01-29 LAB — PREALBUMIN: Prealbumin: 15.6 mg/dL — ABNORMAL LOW (ref 17.0–34.0)

## 2014-10-01 DEATH — deceased

## 2016-02-24 IMAGING — CT CT HEAD W/O CM
1 series · 16 of 27 positions shown, 20 images · non-contrast
Comparison: None.

CLINICAL DATA: Fall.

EXAM:
CT HEAD WITHOUT CONTRAST
TECHNIQUE: Contiguous axial images were obtained from the base of the skull
through the vertex without intravenous contrast.

[Series 2: head 5.0 h30s · axial · 0.43mm/px · z∈[-179,-59]mm · 16 of 27 slices shown, 20 images]
[im 2/27  brain]
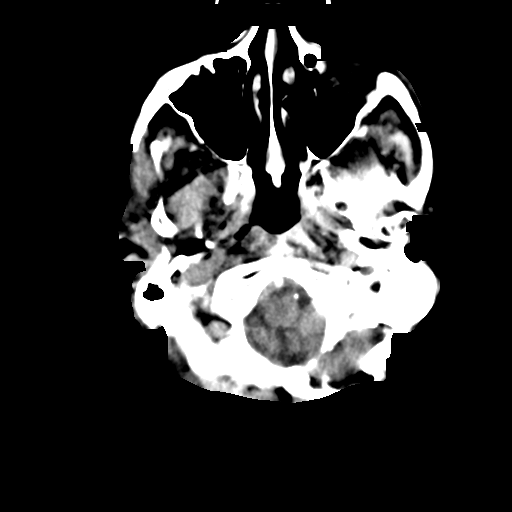
[im 2/27  bone]
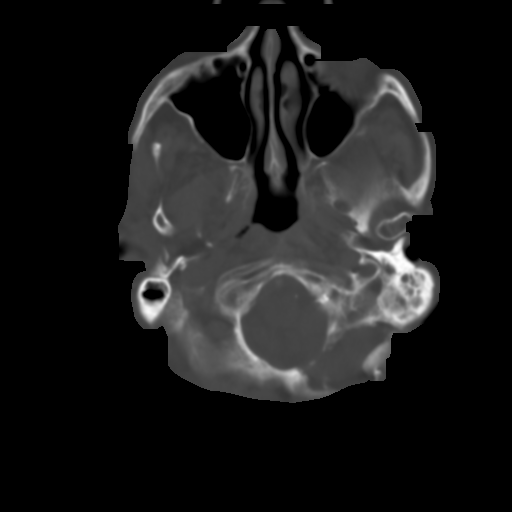
[im 4/27  brain]
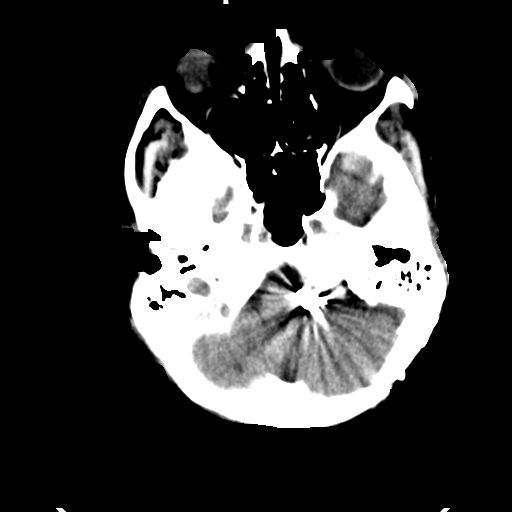
[im 5/27  brain]
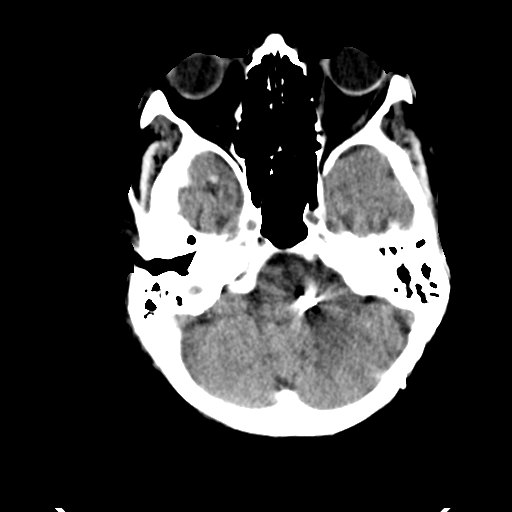
[im 7/27  brain]
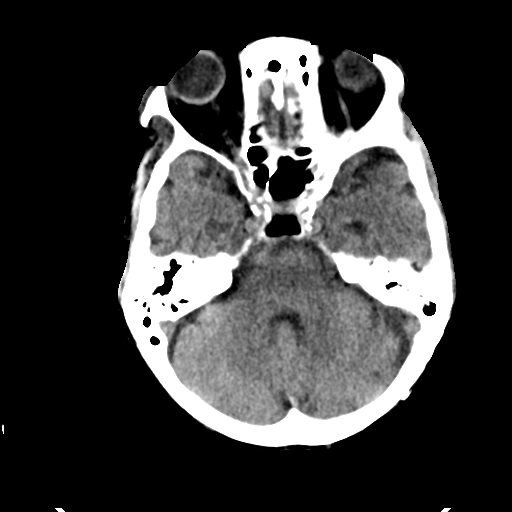
[im 9/27  brain]
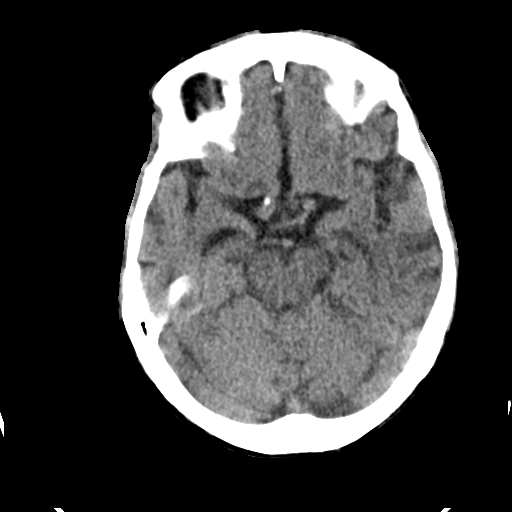
[im 9/27  bone]
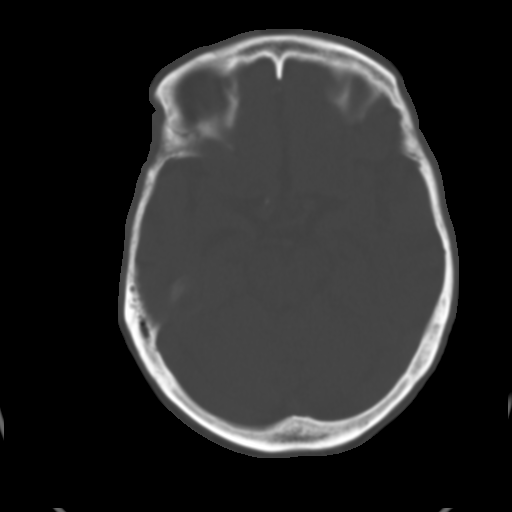
[im 10/27  brain]
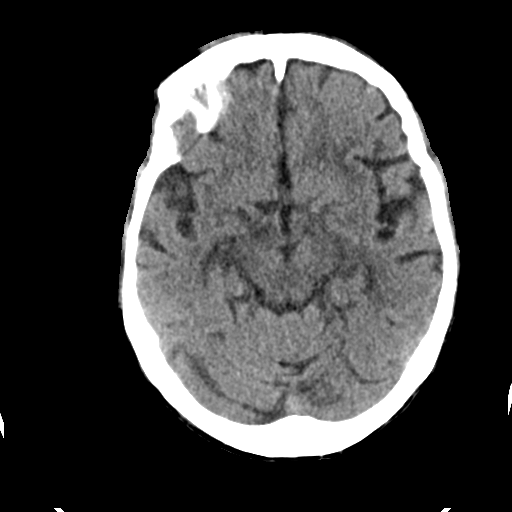
[im 12/27  brain]
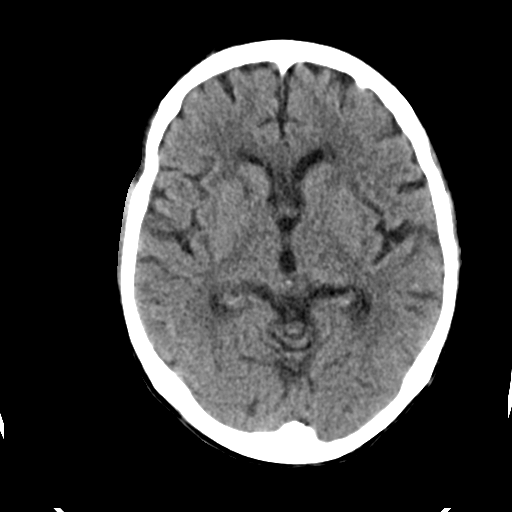
[im 13/27  brain]
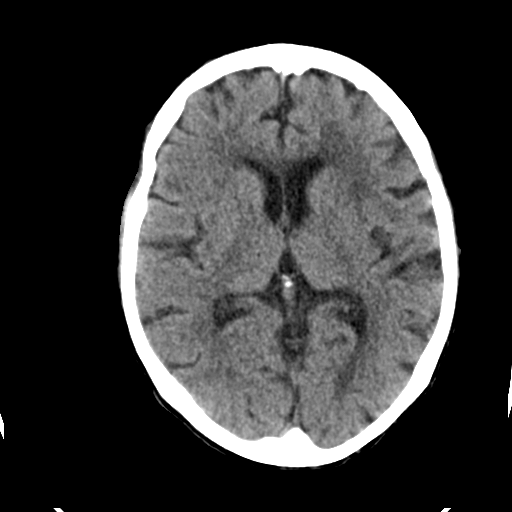
[im 15/27  brain]
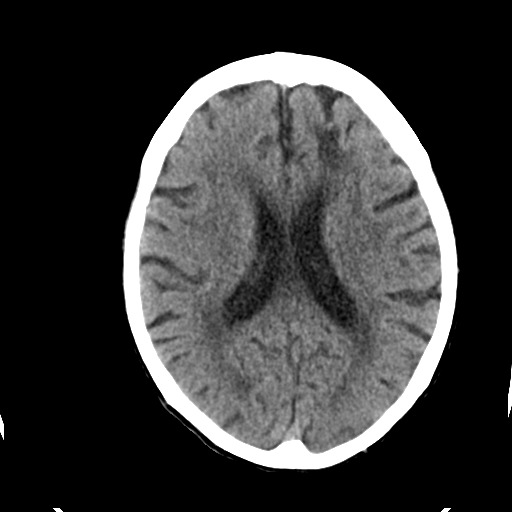
[im 15/27  bone]
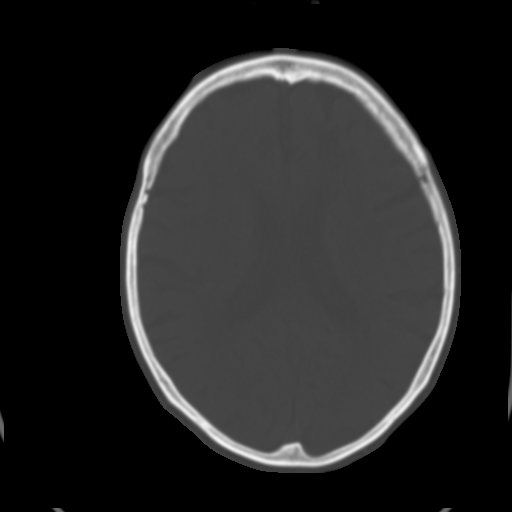
[im 16/27  brain]
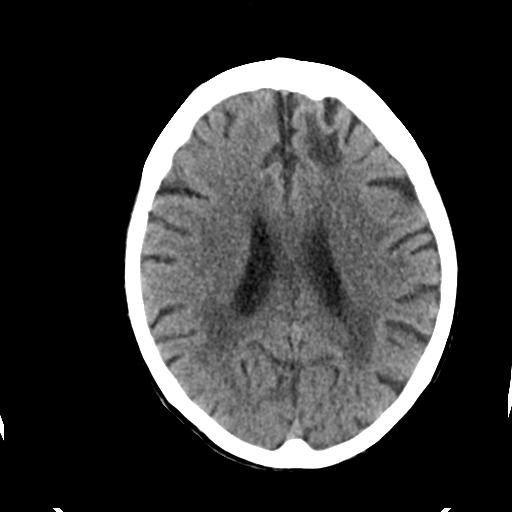
[im 18/27  brain]
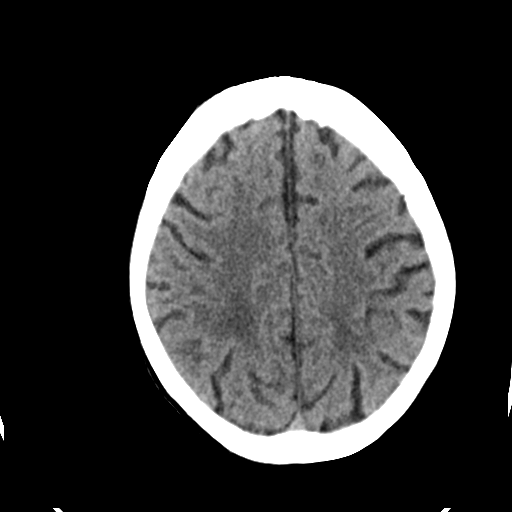
[im 19/27  brain]
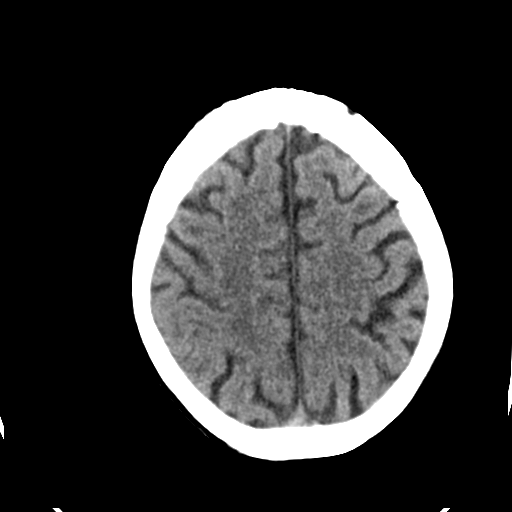
[im 21/27  brain]
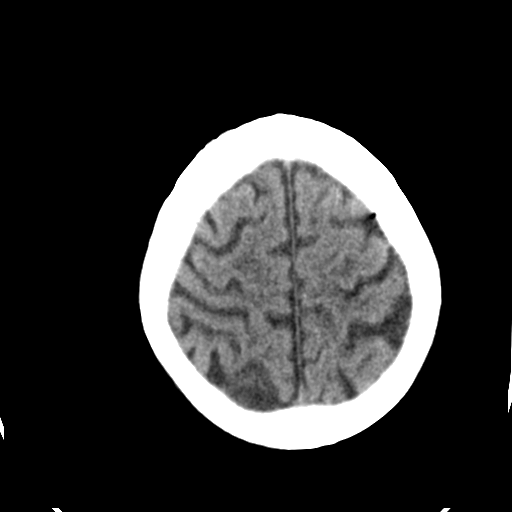
[im 21/27  bone]
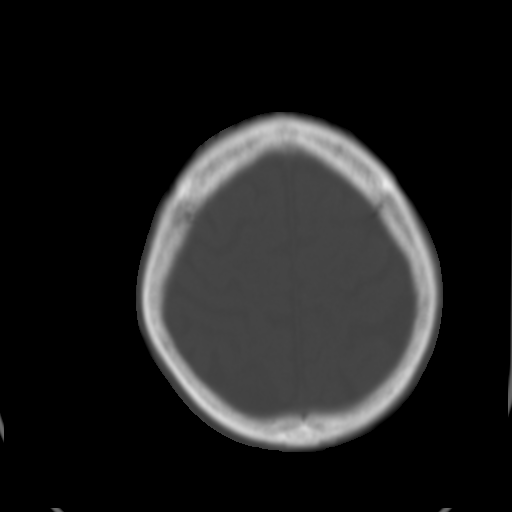
[im 23/27  brain]
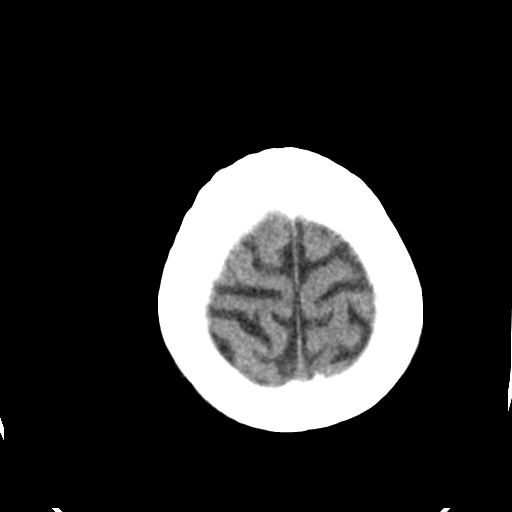
[im 24/27  brain]
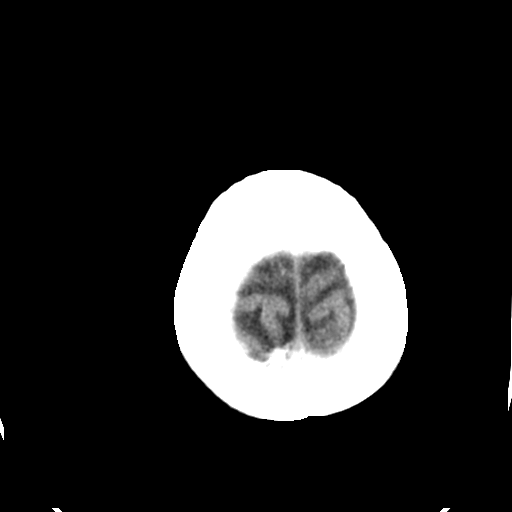
[im 26/27  brain]
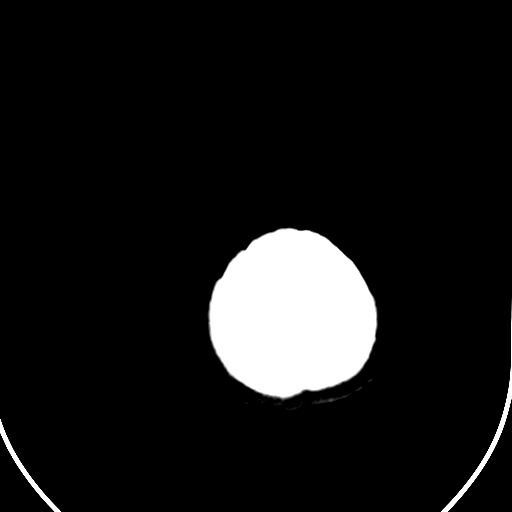

[16 of 27 positions shown; findings below may reference images not displayed]

FINDINGS: Surgical clips are noted in the brain base. No mass. No
hydrocephalus. No hemorrhage. White matter changes noted consistent
with chronic ischemia. No acute bony abnormality identified.
IMPRESSION: 1. Surgical clips brain base.  Chronic white matter ischemia.
2. No acute abnormality.

## 2016-03-20 IMAGING — CT CT ABD-PELV W/ CM
2 of 5 series · 16 of 46 positions shown, 18 images · IV contrast (Omni 300)
Comparison: 01/14/2014

CLINICAL DATA: Distended abdomen.  Previous bowel perforation.

EXAM:
CT ABDOMEN AND PELVIS WITH CONTRAST
TECHNIQUE: Multidetector CT imaging of the abdomen and pelvis was performed
using the standard protocol following bolus administration of
intravenous contrast.
CONTRAST:  80 cc Omnipaque 300

[Series 2: abd/ pelvis 5.0 i30f 1 · axial · 0.70mm/px · z∈[-388,-28]mm · 13 of 82 slices shown, 15 images]
[im 5/82  soft-tissue]
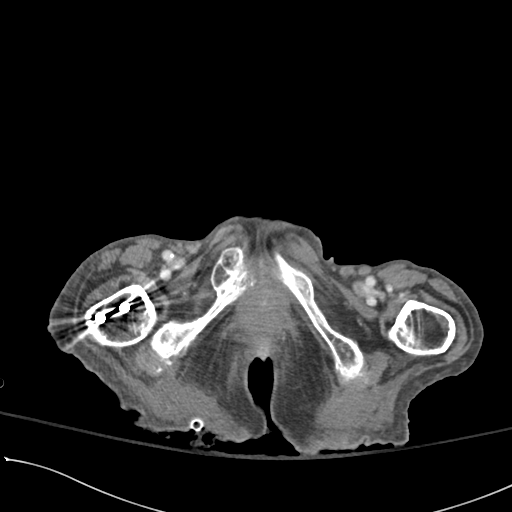
[im 5/82  bone]
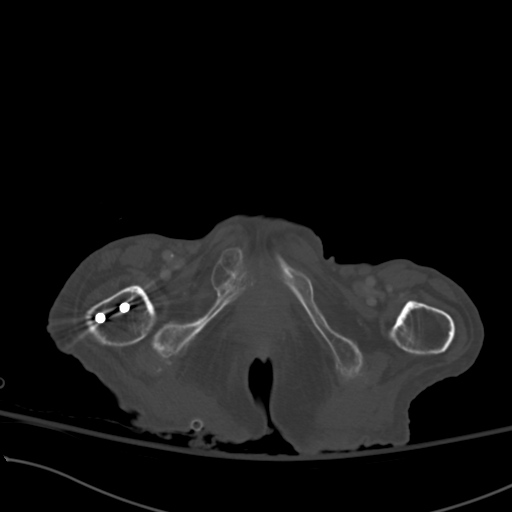
[im 10/82  soft-tissue]
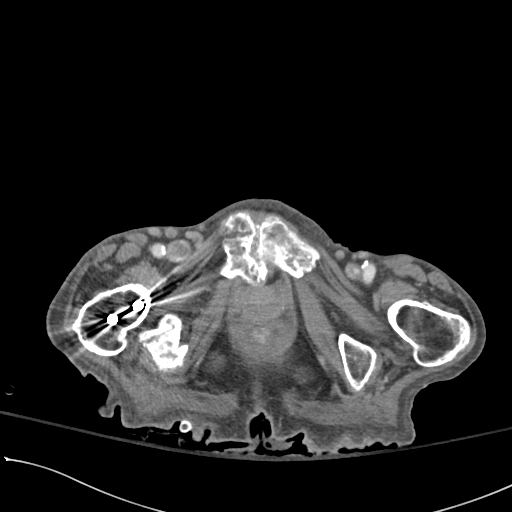
[im 19/82  soft-tissue]
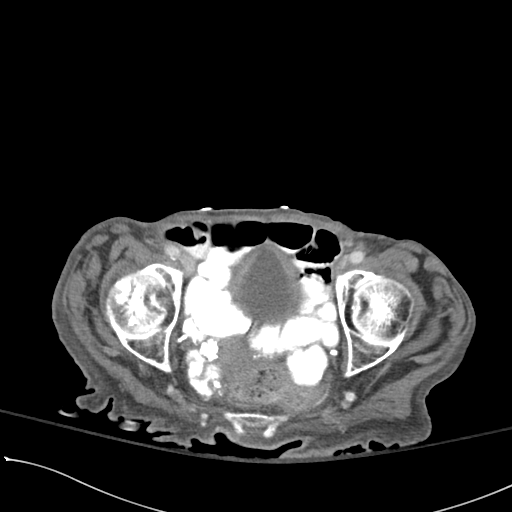
[im 23/82  soft-tissue]
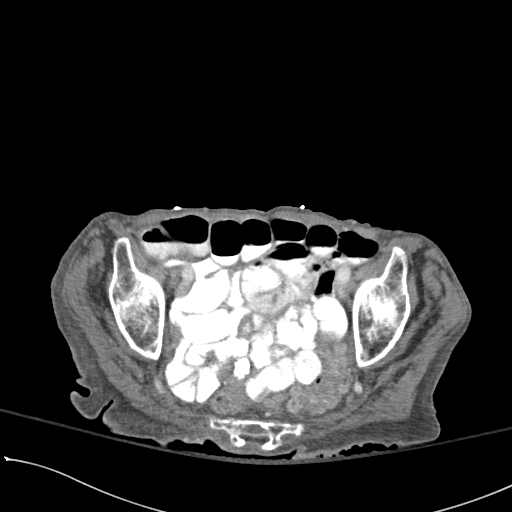
[im 28/82  soft-tissue]
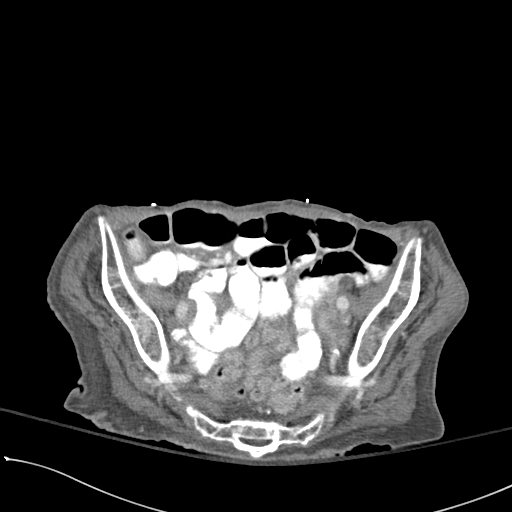
[im 37/82  soft-tissue]
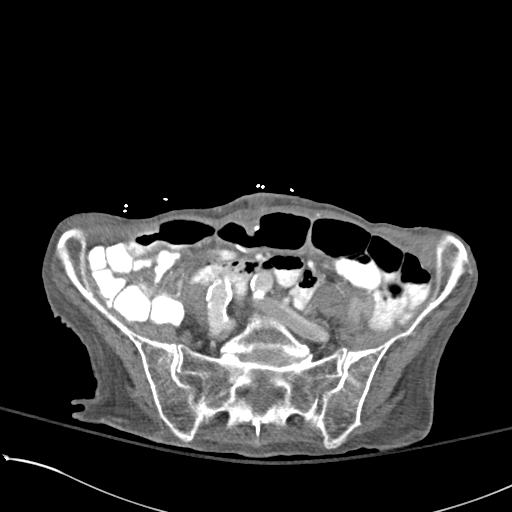
[im 41/82  soft-tissue]
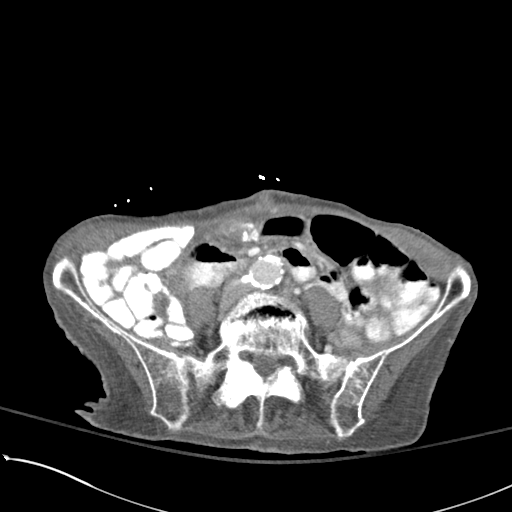
[im 46/82  soft-tissue]
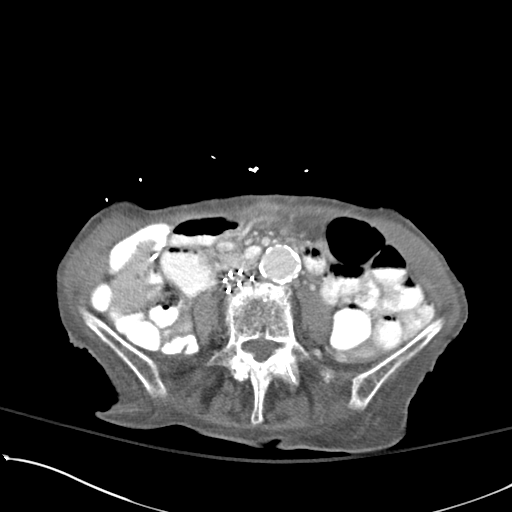
[im 55/82  soft-tissue]
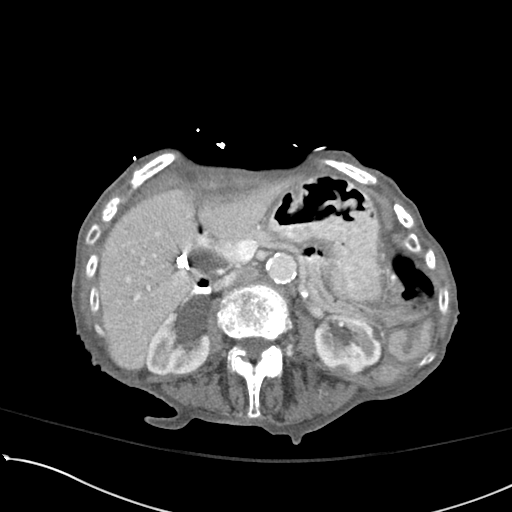
[im 55/82  bone]
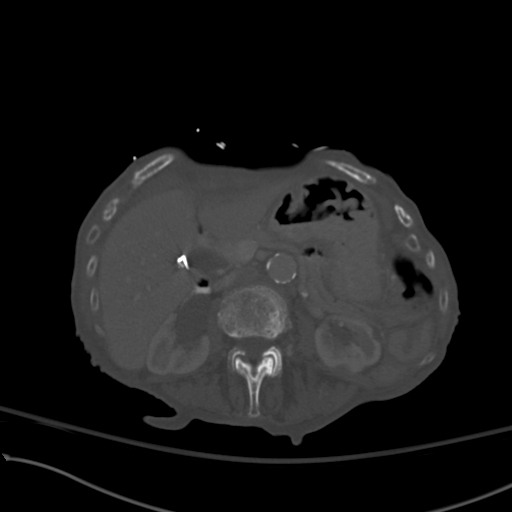
[im 59/82  soft-tissue]
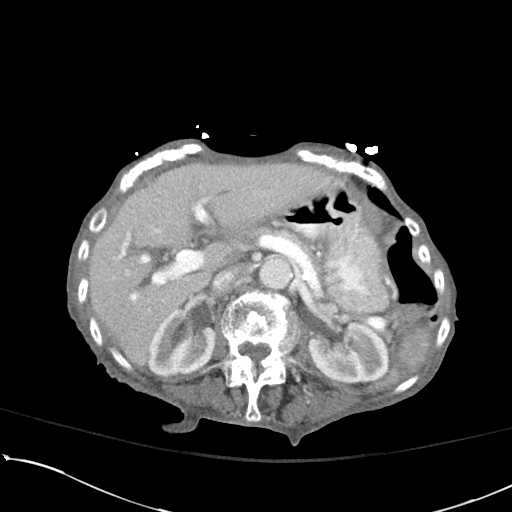
[im 64/82  soft-tissue]
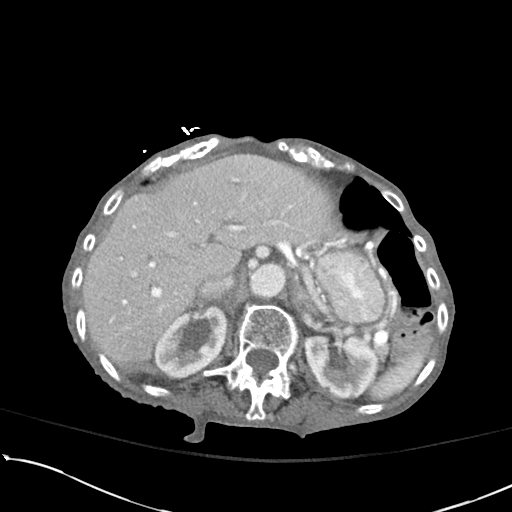
[im 73/82  soft-tissue]
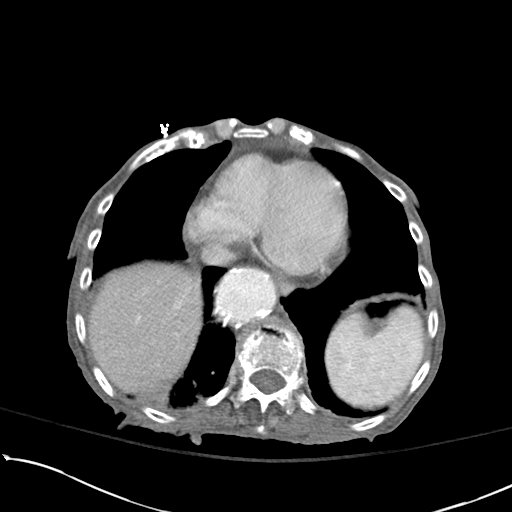
[im 77/82  soft-tissue]
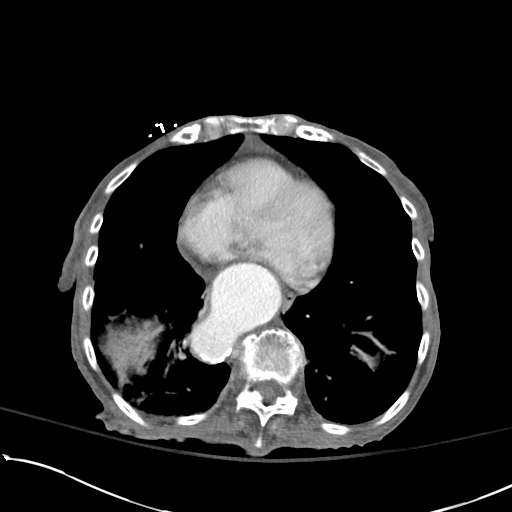

[Series 5: coronals · coronal · 0.68mm/px · 3 of 114 slices shown]
[im 38/114  soft-tissue]
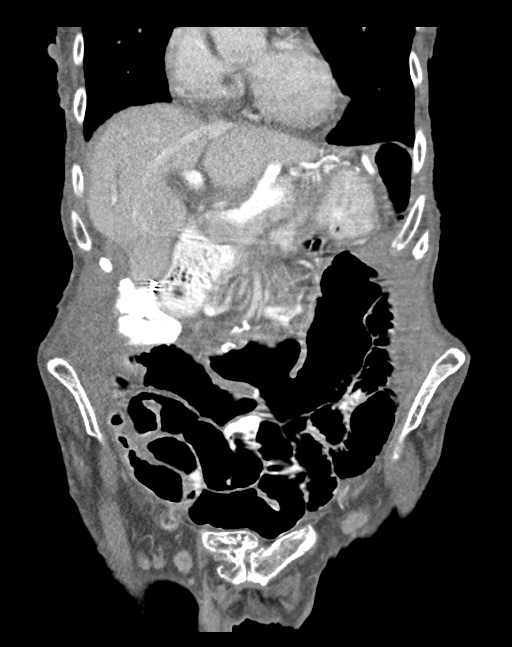
[im 51/114  soft-tissue]
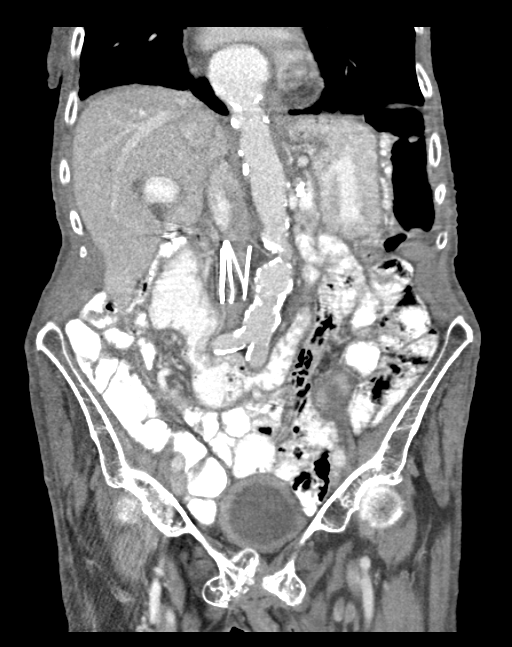
[im 63/114  soft-tissue]
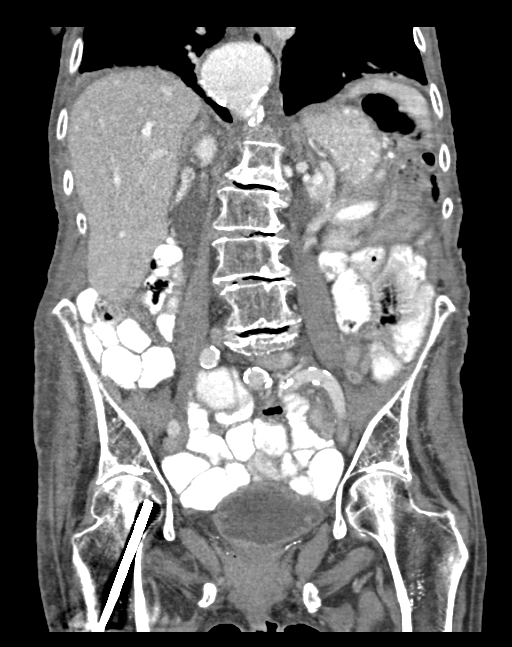

[16 of 46 positions shown; findings below may reference images not displayed]

FINDINGS: Scar remains evident at the lung bases right more than left.
Coronary artery calcification remains visible. Tiny amount of
pericardial fluid. No pleural fluid. There is ectasia of the aorta
which is on the right side of the mediastinum in the lower chest and
swings anterior to the spine where there is a focal aneurysm
measuring 4.8 cm by 5.2 in diameter, unchanged in without evidence
of rupture at this time.

There has been previous cholecystectomy. There is biliary ductal
prominence which is chronic and presumably secondary to the previous
cholecystectomy. No focal liver lesion. The spleen is normal. The
pancreas is normal. The adrenal glands are normal. The patient has
developed bilateral hydronephrosis, more on the right than the left.
I do not see a distinct etiology for that process.

The aorta in the abdominal region shows atherosclerosis with mild
aneurysmal dilatation of the distal aorta with maximal transverse
diameter of 3 cm. IVC filter remains in place. The bowel does not
appear distended or obstructed. No focal bowel lesion is evident. No
free intra-abdominal air. Tiny amount of free fluid in the pelvis.
There is chronic degenerative change of the spine with old partial
compression fracture at T12, unchanged.
IMPRESSION: No abnormality to explain the clinical impression of bowel
distention. No evidence of ileus or obstruction at this time. No
free fluid or air and no evidence of abscess.

Development of mild hydronephrosis on the left and moderate
hydronephrosis on the right since the previous study. The etiology
is unknown.

## 2019-11-30 DEATH — deceased
